# Patient Record
Sex: Male | Born: 1990 | Race: Black or African American | Hispanic: No | Marital: Single | Smoking: Former smoker
Health system: Southern US, Community
[De-identification: ages and names within clinical notes are randomized; demographics above are authoritative.]

---

## 2014-06-25 ENCOUNTER — Emergency Department (HOSPITAL_COMMUNITY)
Admission: EM | Admit: 2014-06-25 | Discharge: 2014-06-25 | Disposition: A | Discharge status: Home / Self Care | Payer: Managed Care, Other (non HMO) | Attending: Emergency Medicine | Admitting: Emergency Medicine

## 2014-06-25 ENCOUNTER — Emergency Department (HOSPITAL_COMMUNITY): Payer: Managed Care, Other (non HMO)

## 2014-06-25 ENCOUNTER — Emergency Department (INDEPENDENT_AMBULATORY_CARE_PROVIDER_SITE_OTHER)
Admission: EM | Admit: 2014-06-25 | Discharge: 2014-06-25 | Disposition: A | Discharge status: Nursing Home (Includes ICF) | Payer: Managed Care, Other (non HMO) | Source: Home / Self Care

## 2014-06-25 ENCOUNTER — Encounter (HOSPITAL_COMMUNITY): Payer: Self-pay | Admitting: Emergency Medicine

## 2014-06-25 DIAGNOSIS — Z7982 Long term (current) use of aspirin: Secondary | ICD-10-CM | POA: Diagnosis not present

## 2014-06-25 DIAGNOSIS — Z79899 Other long term (current) drug therapy: Secondary | ICD-10-CM | POA: Insufficient documentation

## 2014-06-25 DIAGNOSIS — Z87891 Personal history of nicotine dependence: Secondary | ICD-10-CM | POA: Diagnosis not present

## 2014-06-25 DIAGNOSIS — R079 Chest pain, unspecified: Secondary | ICD-10-CM | POA: Diagnosis present

## 2014-06-25 DIAGNOSIS — I209 Angina pectoris, unspecified: Secondary | ICD-10-CM

## 2014-06-25 LAB — CBC WITH DIFFERENTIAL/PLATELET
Basophils Absolute: 0 10*3/uL (ref 0.0–0.1)
Basophils Relative: 0 % (ref 0–1)
EOS ABS: 0.3 10*3/uL (ref 0.0–0.7)
EOS PCT: 3 % (ref 0–5)
HEMATOCRIT: 43.5 % (ref 39.0–52.0)
HEMOGLOBIN: 15.2 g/dL (ref 13.0–17.0)
LYMPHS ABS: 2.9 10*3/uL (ref 0.7–4.0)
Lymphocytes Relative: 31 % (ref 12–46)
MCH: 30.1 pg (ref 26.0–34.0)
MCHC: 34.9 g/dL (ref 30.0–36.0)
MCV: 86.1 fL (ref 78.0–100.0)
MONOS PCT: 9 % (ref 3–12)
Monocytes Absolute: 0.8 10*3/uL (ref 0.1–1.0)
Neutro Abs: 5.3 10*3/uL (ref 1.7–7.7)
Neutrophils Relative %: 57 % (ref 43–77)
Platelets: 215 10*3/uL (ref 150–400)
RBC: 5.05 MIL/uL (ref 4.22–5.81)
RDW: 13.3 % (ref 11.5–15.5)
WBC: 9.3 10*3/uL (ref 4.0–10.5)

## 2014-06-25 LAB — BASIC METABOLIC PANEL
Anion gap: 13 (ref 5–15)
BUN: 13 mg/dL (ref 6–23)
CALCIUM: 9.4 mg/dL (ref 8.4–10.5)
CO2: 26 meq/L (ref 19–32)
Chloride: 102 mEq/L (ref 96–112)
Creatinine, Ser: 1.09 mg/dL (ref 0.50–1.35)
GFR calc Af Amer: >90 mL/min (ref >90)
GFR calc non Af Amer: >90 mL/min (ref >90)
GLUCOSE: 87 mg/dL (ref 70–99)
Potassium: 3.9 mEq/L (ref 3.7–5.3)
SODIUM: 141 meq/L (ref 137–147)

## 2014-06-25 LAB — TROPONIN I: Troponin I: <0.3 ng/mL (ref <0.30)

## 2014-06-25 MED ORDER — NITROGLYCERIN 0.4 MG SL SUBL
SUBLINGUAL_TABLET | SUBLINGUAL | Status: AC
  Filled 2014-06-25: qty 1

## 2014-06-25 MED ORDER — SODIUM CHLORIDE 0.9 % IV SOLN
INTRAVENOUS | Status: DC
  Administered 2014-06-25: 19:00:00 via INTRAVENOUS

## 2014-06-25 MED ORDER — IBUPROFEN 800 MG PO TABS: 800.0000 mg | ORAL_TABLET | Freq: Three times a day (TID) | ORAL | Status: AC

## 2014-06-25 MED ORDER — KETOROLAC TROMETHAMINE 30 MG/ML IJ SOLN
30.0000 mg | Freq: Once | INTRAMUSCULAR | Status: AC
Start: 2014-06-25 — End: 2014-06-25
  Administered 2014-06-25: 30 mg via INTRAVENOUS
  Filled 2014-06-25: qty 1

## 2014-06-25 MED ORDER — ASPIRIN 81 MG PO CHEW
324.0000 mg | CHEWABLE_TABLET | Freq: Once | ORAL | Status: AC
  Administered 2014-06-25: 324 mg via ORAL

## 2014-06-25 MED ORDER — NITROGLYCERIN 0.4 MG SL SUBL
0.4000 mg | SUBLINGUAL_TABLET | SUBLINGUAL | Status: AC | PRN
  Administered 2014-06-25: 0.4 mg via SUBLINGUAL

## 2014-06-25 MED ORDER — ASPIRIN 81 MG PO CHEW
CHEWABLE_TABLET | ORAL | Status: AC
  Filled 2014-06-25: qty 4

## 2014-06-25 NOTE — ED Provider Notes (Signed)
Chief Complaint   Chest Pain   History of Present Illness    Ivan Crawford is a 23 year old male who presents with left pectoral chest pain radiating through to the left upper back since 5 PM today. The patient states he was walking fast at the time, but his parents state he lifted a heavy rug at that time. The pain feels like a stabbing and is rated 6/10 in intensity. It's been associated with some shortness of breath and rapid heart rate. He denies any associated nausea, diaphoresis, or dizziness. He's had no fever, chills, coughing, or wheezing. He denies any dizziness or presyncope. He's had no abdominal pain, nausea, or vomiting. He's been having similar pains for the past 9 months. These have been occurring about once a month and they are exertional, lasting for minutes at a time. He's had 4 none of similar episodes in the past week. None of these have lasted as long as this episode. He is a former smoker and his father has had multiple procedures for ischemic heart disease since age 40. He has no history of high blood pressure although his blood pressure is elevated today. No history of diabetes or hypercholesterolemia. He denies any leg pain or or swelling.  Review of Systems    Other than noted above, the patient denies any of the following symptoms. Systemic:  No fever or chills. Pulmonary:  No cough, wheezing, shortness of breath, sputum production, hemoptysis. Cardiac:  No palpitations, rapid heartbeat, dizziness, presyncope or syncope. GI:  No abdominal pain, heartburn, nausea, or vomiting. Ext:  No leg pain or swelling.  PMFSH    Past medical history, family history, social history, meds, and allergies were reviewed.   Physical Exam     Vital signs:  BP 163/75  Pulse 53  Temp(Src) 98.6 F (37 C) (Oral)  Resp 16  SpO2 100% Gen:  Alert, oriented, in no distress, skin warm and dry. ENT:  Mucous membranes moist, pharynx clear. Neck:  Supple, no adenopathy or tenderness.   No JVD. Lungs:  Clear to auscultation, no wheezes, rales or rhonchi.  No respiratory distress. Heart:  Regular rhythm.  No gallops, murmers, clicks or rubs. Chest:  There is tenderness to palpation in the left pectoral area. Abdomen:  Soft, nontender, no organomegaly or mass.  Bowel sounds normal.  No pulsatile abdominal mass or bruit. Ext:  No edema.  No calf tenderness and Homann's sign negative.  Pulses full and equal. Skin:  Warm and dry.  No rash.  EKG Results:  Date: 06/25/2014  Rate: 55  Rhythm: sinus bradycardia and sinus arrhythmia  QRS Axis: normal--58  Intervals: normal--QTc interval 371 ms  ST/T Wave abnormalities: normal  Conduction Disutrbances:none  Narrative Interpretation: Sinus bradycardia with sinus arrhythmia, otherwise normal EKG, no ischemic changes.  Old EKG Reviewed: none available    Course in Urgent Care Center   The following medications were given:  Medications  0.9 %  sodium chloride infusion   aspirin chewable tablet 324 mg   nitroGLYCERIN (NITROSTAT) SL tablet 0.4 mg  Assessment     The encounter diagnosis was Angina pectoris.  Differential diagnosis is acute coronary syndrome, pulmonary embolism, ruptured aneurysm, pneumothorax, Boerhaave syndrome, pericarditis, musculoskeletal pain, or reflux esophagitis.   Plan     The patient was transferred to the ED via the [facility] in stable condition.  Medical Decision Making:  23 year old male with risk factors of positive family history and ex-cigarette smoker presents tonight with history of left pectoral chest pain with radiation to back since 5 p.m. Today.  No nausea or diaphoresis, but does have shortness of breath and palpitations.  He's had similar pains off and on for the past 9 months, and these seem to be getting more frequent.  Pains are exertional and  last for minutes.  His EKG is normal, but story is concerning for crescendo angina. Will give TNG and ASA and send via [place] [facility].       Reuben Likes, MD 06/25/14 [phone]

## 2014-06-25 NOTE — ED Provider Notes (Signed)
CSN: [id number]     Arrival date & time 06/25/14  1920 History   First MD Initiated Contact with Patient 06/25/14 1920     Chief Complaint  Patient presents with  . Chest Pain     (Consider location/radiation/quality/duration/timing/severity/associated sxs/prior Treatment) HPI  Patient presents with concern of ongoing chest pain.  Pain is present for several months, beginning without clear precipitant.  Since onset has been episodic, seemingly occurring with lifting, exertion, but not with prolonged exertion, or with deep inspiration. Pain is left upper chest, nonradiating, sore, sharp, improved with rest No concurrent syncope, near syncope, post exercise lightheadedness or syncope. Patient used to smoke, drink, use marijuana. Patient endorses family history heart disease, though no early cardiac death.   History reviewed. No pertinent past medical history. History reviewed. No pertinent past surgical history. History reviewed. No pertinent family history. History  Substance Use Topics  . Smoking status: Former Games developer  . Smokeless tobacco: Not on file  . Alcohol Use: Not on file    Review of Systems  Constitutional:       Per HPI, otherwise negative  HENT:       Per HPI, otherwise negative  Respiratory:       Per HPI, otherwise negative  Cardiovascular:       Per HPI, otherwise negative  Gastrointestinal: Negative for vomiting.  Endocrine:       Negative aside from HPI  Genitourinary:       Neg aside from HPI   Musculoskeletal:       Per HPI, otherwise negative  Skin: Negative.   Neurological: Negative for syncope.      Allergies  Review of patient's allergies indicates no known allergies.  Home Medications   Prior to Admission medications   Medication Sig Start Date End Date Taking? Authorizing Provider  aspirin 81 MG chewable tablet Chew 324 mg by mouth once.    Historical Provider, MD  nitroGLYCERIN (NITROSTAT) 0.4 MG SL tablet Place 0.4 mg under the  tongue once.    Historical Provider, MD   BP 135/65  Pulse 47  Temp(Src) 98.3 F (36.8 C) (Oral)  Resp 12  Ht 5' 10.5" (1.791 m)  Wt 154 lb (69.854 kg)  BMI 21.78 kg/m2  SpO2 99% Physical Exam  Nursing note and vitals reviewed. Constitutional: He is oriented to person, place, and time. He appears well-developed. No distress.  HENT:  Head: Normocephalic and atraumatic.  Eyes: Conjunctivae and EOM are normal.  Cardiovascular: Normal rate and regular rhythm.   Pulmonary/Chest: Effort normal. No stridor. No respiratory distress.  Abdominal: He exhibits no distension.  Musculoskeletal: He exhibits no edema.  Neurological: He is alert and oriented to person, place, and time.  Skin: Skin is warm and dry.  Psychiatric: He has a normal mood and affect.    ED Course  Procedures (including critical care time)  Imaging labs Review I reviewed the imaging studies, all labs  EKG Interpretation   Date/Time:  Saturday June 25 2014 19:34:33 EDT Ventricular Rate:  50 PR Interval:  136 QRS Duration: 87 QT Interval:  427 QTC Calculation: 389 R Axis:   79 Text Interpretation:  Sinus rhythm ST elev, probable normal early repol  pattern Sinus rhythm Artifact Early repolarization pattern Borderline ECG  Confirmed by Gerhard Munch  MD [phone]) on 06/25/2014 7:58:27 PM     I reviewed the patient's EKG from urgent care as well as hours, no substantial changes.  On repeat exam the patient is in no  distress with no pain  MDM   Generally well-appearing young male presents with ongoing episodic left-sided chest pain.  Patient is awake, alert, in no distress.  Given the chronicity pain, the absence of substantial risk factors, his unremarkable VS, there is low suspicion for ongoing ischemia.  Patient's pain is likely musculoskeletal. Patient was started on a course of anti-inflammatory, discharged to follow up with primary care.  As he is non-hypoxic, with no dyspnea or increased respiratory  effort, thromboembolic processes are unlikely. Absent lightheadedness or post exertional syncope, occult cardiomyopathy also is unlikely.      Gerhard Munch, MD 06/25/14 2159

## 2014-06-25 NOTE — ED Notes (Signed)
Patient transported to X-ray 

## 2014-06-25 NOTE — ED Notes (Signed)
Patient states was at work today and started to experience chest pain Patient states does have a history of heart disease in his family

## 2014-06-25 NOTE — Discharge Instructions (Signed)
As discussed, your evaluation today has been largely reassuring.  But, it is important that you monitor your condition carefully, and do not hesitate to return to the ED if you develop new, or concerning changes in your condition. ° °Otherwise, please follow-up with your physician for appropriate ongoing care. ° °Chest Pain (Nonspecific) °It is often hard to give a specific diagnosis for the cause of chest pain. There is always a chance that your pain could be related to something serious, such as a heart attack or a blood clot in the lungs. You need to follow up with your health care provider for further evaluation. °CAUSES  °· Heartburn. °· Pneumonia or bronchitis. °· Anxiety or stress. °· Inflammation around your heart (pericarditis) or lung (pleuritis or pleurisy). °· A blood clot in the lung. °· A collapsed lung (pneumothorax). It can develop suddenly on its own (spontaneous pneumothorax) or from trauma to the chest. °· Shingles infection (herpes zoster virus). °The chest wall is composed of bones, muscles, and cartilage. Any of these can be the source of the pain. °· The bones can be bruised by injury. °· The muscles or cartilage can be strained by coughing or overwork. °· The cartilage can be affected by inflammation and become sore (costochondritis). °DIAGNOSIS  °Lab tests or other studies may be needed to find the cause of your pain. Your health care provider may have you take a test called an ambulatory electrocardiogram (ECG). An ECG records your heartbeat patterns over a 24-hour period. You may also have other tests, such as: °· Transthoracic echocardiogram (TTE). During echocardiography, sound waves are used to evaluate how blood flows through your heart. °· Transesophageal echocardiogram (TEE). °· Cardiac monitoring. This allows your health care provider to monitor your heart rate and rhythm in real time. °· Holter monitor. This is a portable device that records your heartbeat and can help diagnose  heart arrhythmias. It allows your health care provider to track your heart activity for several days, if needed. °· Stress tests by exercise or by giving medicine that makes the heart beat faster. °TREATMENT  °· Treatment depends on what may be causing your chest pain. Treatment may include: °¨ Acid blockers for heartburn. °¨ Anti-inflammatory medicine. °¨ Pain medicine for inflammatory conditions. °¨ Antibiotics if an infection is present. °· You may be advised to change lifestyle habits. This includes stopping smoking and avoiding alcohol, caffeine, and chocolate. °· You may be advised to keep your head raised (elevated) when sleeping. This reduces the chance of acid going backward from your stomach into your esophagus. °Most of the time, nonspecific chest pain will improve within 2-3 days with rest and mild pain medicine.  °HOME CARE INSTRUCTIONS  °· If antibiotics were prescribed, take them as directed. Finish them even if you start to feel better. °· For the next few days, avoid physical activities that bring on chest pain. Continue physical activities as directed. °· Do not use any tobacco products, including cigarettes, chewing tobacco, or electronic cigarettes. °· Avoid drinking alcohol. °· Only take medicine as directed by your health care provider. °· Follow your health care provider's suggestions for further testing if your chest pain does not go away. °· Keep any follow-up appointments you made. If you do not go to an appointment, you could develop lasting (chronic) problems with pain. If there is any problem keeping an appointment, call to reschedule. °SEEK MEDICAL CARE IF:  °· Your chest pain does not go away, even after treatment. °· You have   a rash with blisters on your chest. °· You have a fever. °SEEK IMMEDIATE MEDICAL CARE IF:  °· You have increased chest pain or pain that spreads to your arm, neck, jaw, back, or abdomen. °· You have shortness of breath. °· You have an increasing cough, or you  cough up blood. °· You have severe back or abdominal pain. °· You feel nauseous or vomit. °· You have severe weakness. °· You faint. °· You have chills. °This is an emergency. Do not wait to see if the pain will go away. Get medical help at once. Call your local emergency services (911 in [place] Do not drive yourself to the hospital. °MAKE SURE YOU:  °· Understand these instructions. °· Will watch your condition. °· Will get help right away if you are not doing well or get worse. °Document Released: 06/26/2005 Document Revised: 09/21/2013 Document Reviewed: 04/21/2008 °ExitCare® Patient Information ©2015 [employer]. This information is not intended to replace advice given to you by your health care provider. Make sure you discuss any questions you have with your health care provider. ° °

## 2014-06-25 NOTE — Discharge Instructions (Signed)
We have determined that your problem requires further evaluation in the emergency department.  We will take care of your transport there.  Once at the emergency department, you will be evaluated by a provider and they will order whatever treatment or tests they deem necessary.  We cannot guarantee that they will do any specific test or do any specific treatment.  ° °

## 2014-06-25 NOTE — ED Notes (Signed)
To room via EMS.  Pt transported from urgent care.  Pt reports onset 5pm left sided chest pain.  Constant, stabbing.  No c/o pain at this time.  No other s/s noted.  Pt at work at onset.  Pt reports same pain intermittantly x 9 months.

## 2014-08-07 ENCOUNTER — Encounter (HOSPITAL_COMMUNITY): Payer: Self-pay | Admitting: *Deleted

## 2014-08-07 ENCOUNTER — Emergency Department (INDEPENDENT_AMBULATORY_CARE_PROVIDER_SITE_OTHER)
Admission: EM | Admit: 2014-08-07 | Discharge: 2014-08-07 | Disposition: A | Discharge status: Home / Self Care | Payer: Managed Care, Other (non HMO) | Source: Home / Self Care | Attending: Family Medicine | Admitting: Family Medicine

## 2014-08-07 DIAGNOSIS — L508 Other urticaria: Secondary | ICD-10-CM

## 2014-08-07 MED ORDER — TRIAMCINOLONE ACETONIDE 40 MG/ML IJ SUSP
INTRAMUSCULAR | Status: AC
  Filled 2014-08-07: qty 1

## 2014-08-07 MED ORDER — DIPHENHYDRAMINE HCL 25 MG PO CAPS
50.0000 mg | ORAL_CAPSULE | Freq: Once | ORAL | Status: AC
  Administered 2014-08-07: 50 mg via ORAL

## 2014-08-07 MED ORDER — TRIAMCINOLONE ACETONIDE 40 MG/ML IJ SUSP
40.0000 mg | Freq: Once | INTRAMUSCULAR | Status: AC
  Administered 2014-08-07: 40 mg via INTRAMUSCULAR

## 2014-08-07 MED ORDER — METHYLPREDNISOLONE ACETATE 80 MG/ML IJ SUSP
INTRAMUSCULAR | Status: AC
  Filled 2014-08-07: qty 1

## 2014-08-07 MED ORDER — METHYLPREDNISOLONE ACETATE PF 80 MG/ML IJ SUSP
80.0000 mg | Freq: Once | INTRAMUSCULAR | Status: AC
  Administered 2014-08-07: 80 mg via INTRAMUSCULAR

## 2014-08-07 MED ORDER — DIPHENHYDRAMINE HCL 25 MG PO CAPS
ORAL_CAPSULE | ORAL | Status: AC
  Filled 2014-08-07: qty 2

## 2014-08-07 NOTE — ED Notes (Addendum)
Pt  Reports  He  Developed      A  Rash  Today   With  Hives        And   Some  Shortness     Of           Breath  Earlier            At  This  Time   He       Is  Sitting upright on     Exam  Table         Alert  And  Oriented   Used  New  Detergent today

## 2014-08-07 NOTE — ED Provider Notes (Addendum)
CSN: [id number]     Arrival date & time 08/07/14  1600 History   First MD Initiated Contact with Patient 08/07/14 1613     Chief Complaint  Patient presents with  . Rash   (Consider location/radiation/quality/duration/timing/severity/associated sxs/prior Treatment) Patient is a 23 y.o. male presenting with rash. The history is provided by the patient.  Rash Location:  Torso Torso rash location:  R chest, L chest, abd RUQ and abd RLQ Quality: itchiness   Severity:  Mild Progression:  Unchanged Chronicity:  New Context: new detergent/soap   Context comment:  Onset after putting on new shirt. no resp problems. Associated symptoms: no fever, no shortness of breath, no sore throat, no throat swelling, no tongue swelling and not wheezing     History reviewed. No pertinent past medical history. History reviewed. No pertinent past surgical history. History reviewed. No pertinent family history. History  Substance Use Topics  . Smoking status: Former Games developermoker  . Smokeless tobacco: Not on file  . Alcohol Use: No    Review of Systems  Constitutional: Negative for fever.  HENT: Negative for sore throat.   Respiratory: Negative for shortness of breath and wheezing.   Cardiovascular: Negative.   Gastrointestinal: Negative.   Musculoskeletal: Negative.   Skin: Positive for rash.    Allergies  Review of patient's allergies indicates no known allergies.  Home Medications   Prior to Admission medications   Medication Sig Start Date End Date Taking? Authorizing Provider  aspirin 81 MG chewable tablet Chew 324 mg by mouth once.    Historical Provider, MD  ibuprofen (ADVIL,MOTRIN) 800 MG tablet Take 1 tablet (800 mg total) by mouth 3 (three) times daily. 06/25/14   Gerhard Munchobert Lockwood, MD  nitroGLYCERIN (NITROSTAT) 0.4 MG SL tablet Place 0.4 mg under the tongue once.    Historical Provider, MD   BP 143/93 mmHg  Pulse 76  Temp(Src) 97.7 F (36.5 C) (Oral)  Resp 18  SpO2 95% Physical Exam   Constitutional: He is oriented to person, place, and time. He appears well-developed and well-nourished. No distress.  HENT:  Mouth/Throat: Oropharynx is clear and moist.  Neck: Normal range of motion. Neck supple.  Cardiovascular: Regular rhythm and normal heart sounds.   Pulmonary/Chest: Effort normal and breath sounds normal.  Neurological: He is alert and oriented to person, place, and time.  Skin: Skin is warm and dry. Rash noted.  Urticarial Edmonia Caprio/hives diffuse rash  Nursing note and vitals reviewed.   ED Course  Procedures (including critical care time) Labs Review Labs Reviewed - No data to display  Imaging Review No results found.   MDM   1. Urticaria, acute        Linna HoffJames D Kindl, MD 08/07/14 1627  Linna HoffJames D Kindl, MD 08/07/14 1725

## 2014-08-07 NOTE — Discharge Instructions (Signed)
Use benadryl as needed, see your doctor or return as needed.

## 2015-01-27 ENCOUNTER — Emergency Department (INDEPENDENT_AMBULATORY_CARE_PROVIDER_SITE_OTHER)
Admission: EM | Admit: 2015-01-27 | Discharge: 2015-01-27 | Disposition: A | Discharge status: Home / Self Care | Payer: Managed Care, Other (non HMO) | Source: Home / Self Care | Attending: Family Medicine | Admitting: Family Medicine

## 2015-01-27 ENCOUNTER — Encounter (HOSPITAL_COMMUNITY): Payer: Self-pay | Admitting: *Deleted

## 2015-01-27 DIAGNOSIS — R059 Cough, unspecified: Secondary | ICD-10-CM

## 2015-01-27 DIAGNOSIS — R05 Cough: Secondary | ICD-10-CM

## 2015-01-27 MED ORDER — FEXOFENADINE HCL 180 MG PO TABS: 180.0000 mg | ORAL_TABLET | Freq: Every day | ORAL | Status: AC

## 2015-01-27 MED ORDER — PSEUDOEPH-BROMPHEN-DM 30-2-10 MG/5ML PO SYRP: 10.0000 mL | ORAL_SOLUTION | Freq: Four times a day (QID) | ORAL | Status: AC | PRN

## 2015-01-27 MED ORDER — FLUTICASONE PROPIONATE 50 MCG/ACT NA SUSP: 2.0000 | Freq: Two times a day (BID) | NASAL | Status: AC

## 2015-01-27 MED ORDER — OMEPRAZOLE 20 MG PO CPDR: 20.0000 mg | DELAYED_RELEASE_CAPSULE | Freq: Every day | ORAL | Status: AC

## 2015-01-27 NOTE — ED Provider Notes (Signed)
CSN: [id number]     Arrival date & time 01/27/15  [id number] History   First MD Initiated Contact with Patient 01/27/15 1114     Chief Complaint  Patient presents with  . URI   (Consider location/radiation/quality/duration/timing/severity/associated sxs/prior Treatment) HPI      24 year old male presents for evaluation of a cough. This is been present for approximately 2 weeks. He describes it as a very hard, forceful cough. It is occasionally productive and dry at other times. He denies any other associated symptoms including no fever, chest pain, shortness of breath, rhinorrhea, postnasal drip. He denies any illness preceding this. He admits to a history of occasional heartburn and reflux. He also has seasonal allergies but they have not been bothering him much now.   History reviewed. No pertinent past medical history. History reviewed. No pertinent past surgical history. History reviewed. No pertinent family history. History  Substance Use Topics  . Smoking status: Former Games developermoker  . Smokeless tobacco: Not on file  . Alcohol Use: No    Review of Systems  Constitutional: Negative for fever and chills.  HENT: Negative for congestion and rhinorrhea.   Respiratory: Positive for cough. Negative for shortness of breath.   Cardiovascular: Negative for chest pain.  All other systems reviewed and are negative.   Allergies  Review of patient's allergies indicates no known allergies.  Home Medications   Prior to Admission medications   Medication Sig Start Date End Date Taking? Authorizing Provider  aspirin 81 MG chewable tablet Chew 324 mg by mouth once.    Historical Provider, MD  brompheniramine-pseudoephedrine-DM 30-2-10 MG/5ML syrup Take 10 mLs by mouth 4 (four) times daily as needed. 01/27/15   Graylon GoodZachary H Connelly [place], PA-C  fexofenadine (ALLEGRA ALLERGY) 180 MG tablet Take 1 tablet (180 mg total) by mouth daily. 01/27/15   Adrian BlackwaterZachary H Fiza Nation, PA-C  fluticasone (FLONASE) 50 MCG/ACT nasal spray Place  2 sprays into both nostrils 2 (two) times daily. Decrease to 2 sprays/nostril daily after 5 days 01/27/15   Graylon GoodZachary H Floreine Kingdon, PA-C  ibuprofen (ADVIL,MOTRIN) 800 MG tablet Take 1 tablet (800 mg total) by mouth 3 (three) times daily. 06/25/14   Gerhard Munchobert Lockwood, MD  nitroGLYCERIN (NITROSTAT) 0.4 MG SL tablet Place 0.4 mg under the tongue once.    Historical Provider, MD  omeprazole (PRILOSEC) 20 MG capsule Take 1 capsule (20 mg total) by mouth daily. 01/27/15   Adrian BlackwaterZachary H Hipolito Martinezlopez, PA-C   BP 144/71 mmHg  Pulse 64  Temp(Src) 98.6 F (37 C) (Oral)  Resp 14  SpO2 98% Physical Exam  Constitutional: He is oriented to person, place, and time. He appears well-developed and well-nourished. No distress.  HENT:  Head: Normocephalic and atraumatic.  Right Ear: External ear normal.  Left Ear: External ear normal.  Nose: Nose normal.  Mouth/Throat: Oropharynx is clear and moist. No oropharyngeal exudate.  Eyes: Conjunctivae are normal.  Neck: Normal range of motion. Neck supple.  Cardiovascular: Normal rate, regular rhythm and normal heart sounds.   Pulmonary/Chest: Effort normal and breath sounds normal. No respiratory distress.  Lymphadenopathy:    He has no cervical adenopathy.  Neurological: He is alert and oriented to person, place, and time. Coordination normal.  Skin: Skin is warm and dry. No rash noted. He is not diaphoretic.  Psychiatric: He has a normal mood and affect. Judgment normal.  Nursing note and vitals reviewed.   ED Course  Procedures (including critical care time) Labs Review Labs Reviewed - No data to display  Imaging  Review No results found.   MDM   1. Cough     vitals and physical exam are entirely normal. Treat for both allergies and for silent reflux. Also cough suppressant. Follow-up if no improvement in a couple weeks  Meds ordered this encounter  Medications  . fluticasone (FLONASE) 50 MCG/ACT nasal spray    Sig: Place 2 sprays into both nostrils 2 (two) times  daily. Decrease to 2 sprays/nostril daily after 5 days    Dispense:  16 g    Refill:  2  . fexofenadine (ALLEGRA ALLERGY) 180 MG tablet    Sig: Take 1 tablet (180 mg total) by mouth daily.    Dispense:  30 tablet    Refill:  2  . omeprazole (PRILOSEC) 20 MG capsule    Sig: Take 1 capsule (20 mg total) by mouth daily.    Dispense:  30 capsule    Refill:  2  . brompheniramine-pseudoephedrine-DM 30-2-10 MG/5ML syrup    Sig: Take 10 mLs by mouth 4 (four) times daily as needed.    Dispense:  120 mL    Refill:  2       Graylon Good, PA-C 01/27/15 1138

## 2015-01-27 NOTE — Discharge Instructions (Signed)
Cough, Adult  A cough is a reflex that helps clear your throat and airways. It can help heal the body or may be a reaction to an irritated airway. A cough may only last 2 or 3 weeks (acute) or may last more than 8 weeks (chronic).  CAUSES Acute cough:  Viral or bacterial infections. Chronic cough:  Infections.  Allergies.  Asthma.  Post-nasal drip.  Smoking.  Heartburn or acid reflux.  Some medicines.  Chronic lung problems (COPD).  Cancer. SYMPTOMS   Cough.  Fever.  Chest pain.  Increased breathing rate.  High-pitched whistling sound when breathing (wheezing).  Colored mucus that you cough up (sputum). TREATMENT   A bacterial cough may be treated with antibiotic medicine.  A viral cough must run its course and will not respond to antibiotics.  Your caregiver may recommend other treatments if you have a chronic cough. HOME CARE INSTRUCTIONS   Only take over-the-counter or prescription medicines for pain, discomfort, or fever as directed by your caregiver. Use cough suppressants only as directed by your caregiver.  Use a cold steam vaporizer or humidifier in your bedroom or home to help loosen secretions.  Sleep in a semi-upright position if your cough is worse at night.  Rest as needed.  Stop smoking if you smoke. SEEK IMMEDIATE MEDICAL CARE IF:   You have pus in your sputum.  Your cough starts to worsen.  You cannot control your cough with suppressants and are losing sleep.  You begin coughing up blood.  You have difficulty breathing.  You develop pain which is getting worse or is uncontrolled with medicine.  You have a fever. MAKE SURE YOU:   Understand these instructions.  Will watch your condition.  Will get help right away if you are not doing well or get worse. Document Released: 03/15/2011 Document Revised: 12/09/2011 Document Reviewed: 03/15/2011 ExitCare Patient Information 2015 [employer]. This information is not intended  to replace advice given to you by your health care provider. Make sure you discuss any questions you have with your health care provider.  

## 2015-01-27 NOTE — ED Notes (Signed)
Pt  Reports symptoms    Of  Cough  Congested    With  Onset  Of symptoms    For  sev   Weeks         Pt  Reports  Symptoms  Not releived  By  otc  meds     Pt  Sitting  Upright on  The  Exam table speaking in   Complete  sentances  And  Is  In no  Acute  Distress

## 2015-03-27 ENCOUNTER — Encounter (HOSPITAL_COMMUNITY): Payer: Self-pay | Admitting: Emergency Medicine

## 2015-03-27 ENCOUNTER — Emergency Department (INDEPENDENT_AMBULATORY_CARE_PROVIDER_SITE_OTHER)
Admission: EM | Admit: 2015-03-27 | Discharge: 2015-03-27 | Disposition: A | Discharge status: Home / Self Care | Payer: Managed Care, Other (non HMO) | Source: Home / Self Care | Attending: Family Medicine | Admitting: Family Medicine

## 2015-03-27 DIAGNOSIS — M7671 Peroneal tendinitis, right leg: Secondary | ICD-10-CM

## 2015-03-27 DIAGNOSIS — M7751 Other enthesopathy of right foot: Secondary | ICD-10-CM

## 2015-03-27 NOTE — Discharge Instructions (Signed)
Peroneal Tendinitis with Rehab Tendonitis is inflammation of a tendon. Inflammation of the tendons on the back of the outer ankle (peroneal tendons) is known as peroneal tendonitis. The peroneal tendons are responsible for connecting the muscles that allow you to stand on your tiptoes to the bones of the ankle. For this reason, peroneal tendonitis often causes pain when trying to complete such motions. Peroneal tendonitis often involves a tear (strain) of the peroneal tendons. Strains are classified into three categories. Grade 1 strains cause pain, but the tendon is not lengthened. Grade 2 strains include a lengthened ligament, due to the ligament being stretched or partially ruptured. With grade 2 strains there is still function, although function may be decreased. Grade 3 strains involve a complete tear of the tendon or muscle, and function is usually impaired. SYMPTOMS   Pain, tenderness, swelling, warmth, or redness over the back of the outer side of the ankle, the outer part of the mid-foot, or the bottom of the arch.  Pain that gets worse with ankle motion (especially when pushing off or pushing down with the front of the foot), or when standing on the ball of the foot or pushing the foot outward.  Crackling sound (crepitation) when the tendon is moved or touched. CAUSES  Peroneal tendinitis occurs when injury to the peroneal tendons causes the body to respond with inflammation. Common causes of injury include:  An overuse injury, in which the groove behind the outer ankle (where the tendon is located) causes wear on the tendon.  A sudden stress placed on the tendon, such as from an increase in the intensity, frequency, or duration of training.  Direct hit (trauma) to the tendon.  Return to activity too soon after a previous ankle injury. RISK INCREASES WITH:  Sports that require sudden, repetitive pushing off of the foot, such as jumping or quick starts.  Kicking and running sports,  especially running down hills or long distances.  Poor strength and flexibility.  Previous injury to the foot, ankle, or leg. PREVENTION  Warm up and stretch properly before activity.  Allow for adequate recovery between workouts.  Maintain physical fitness:  Strength, flexibility, and endurance.  Cardiovascular fitness.  Complete rehabilitation after previous injury. PROGNOSIS  If treated properly, peroneal tendonitis usually heals within 6 weeks.  RELATED COMPLICATIONS  Longer healing time, if not properly treated or if not given enough time to heal.  Recurring symptoms if activity is resumed too soon, with overuse, or when using poor technique.  If untreated, tendinitis may result in tendon rupture, requiring surgery. TREATMENT  Treatment first involves the use of ice and medicine to reduce pain and inflammation. The use of strengthening and stretching exercises may help reduce pain with activity. These exercises may be performed at home or with a therapist. Sometimes, the foot and ankle will be restrained for 10 to 14 days to promote healing. Your caregiver may advise that you place a heel lift in your shoes to reduce the stress placed on the tendon. If nonsurgical treatment is unsuccessful, surgery to remove the inflamed tendon lining (sheath) may be advised.  MEDICATION   If pain medicine is needed, nonsteroidal anti-inflammatory medicines (aspirin and ibuprofen), or other minor pain relievers (acetaminophen), are often advised.  Do not take pain medicine for 7 days before surgery.  Prescription pain relievers may be given, if your caregiver thinks they are needed. Use only as directed and only as much as you need. HEAT AND COLD  Cold treatment (icing) should   be applied for 10 to 15 minutes every 2 to 3 hours for inflammation and pain, and immediately after activity that aggravates your symptoms. Use ice packs or an ice massage.  Heat treatment may be used before  performing stretching and strengthening activities prescribed by your caregiver, physical therapist, or athletic trainer. Use a heat pack or a warm water soak. SEEK MEDICAL CARE IF:  Symptoms get worse or do not improve in 2 to 4 weeks, despite treatment.  New, unexplained symptoms develop. (Drugs used in treatment may produce side effects.) EXERCISES RANGE OF MOTION (ROM) AND STRETCHING EXERCISES - Peroneal Tendinitis These exercises may help you when beginning to rehabilitate your injury. Your symptoms may resolve with or without further involvement from your physician, physical therapist or athletic trainer. While completing these exercises, remember:   Restoring tissue flexibility helps normal motion to return to the joints. This allows healthier, less painful movement and activity.  An effective stretch should be held for at least 30 seconds.  A stretch should never be painful. You should only feel a gentle lengthening or release in the stretched tissue. RANGE OF MOTION - Ankle Eversion  Sit with your right / left ankle crossed over your opposite knee.  Grip your foot with your opposite hand, placing your thumb on the top of your foot and your fingers across the bottom of your foot.  Gently push your foot downward with a slight rotation, so your littlest toes rise slightly toward the ceiling.  You should feel a gentle stretch on the inside of your ankle. Hold the stretch for __________ seconds. Repeat __________ times. Complete this exercise __________ times per day.  RANGE OF MOTION - Ankle Inversion  Sit with your right / left ankle crossed over your opposite knee.  Grip your foot with your opposite hand, placing your thumb on the bottom of your foot and your fingers across the top of your foot.  Gently pull your foot so the smallest toe comes toward you and your thumb pushes the inside of the ball of your foot away from you.  You should feel a gentle stretch on the outside of  your ankle. Hold the stretch for __________ seconds. Repeat __________ times. Complete this exercise __________ times per day.  RANGE OF MOTION - Ankle Plantar Flexion  Sit with your right / left leg crossed over your opposite knee.  Use your opposite hand to pull the top of your foot and toes toward you.  You should feel a gentle stretch on the top of your foot and ankle. Hold this position for __________ seconds. Repeat __________ times. Complete __________ times per day.  STRETCH - Gastroc, Standing  Place your hands on a wall.  Extend your right / left leg behind you, keeping the front knee somewhat bent.  Slightly point your toes inward on your back foot.  Keeping your right / left heel on the floor and your knee straight, shift your weight toward the wall, not allowing your back to arch.  You should feel a gentle stretch in the calf. Hold this position for __________ seconds. Repeat __________ times. Complete this stretch __________ times per day. STRETCH - Soleus, Standing  Place your hands on a wall.  Extend your right / left leg behind you, keeping the other knee somewhat bent.  Slightly point your toes inward on your back foot.  Keep your heel on the floor, bend your back knee, and slightly shift your weight over the back leg so that   you feel a gentle stretch deep in your back calf.  Hold this position for __________ seconds. Repeat __________ times. Complete this stretch __________ times per day. STRETCH - Gastrocsoleus, Standing Note: This exercise can place a lot of stress on your foot and ankle. Please complete this exercise only if specifically instructed by your caregiver.   Place the ball of your right / left foot on a step, keeping your other foot firmly on the same step.  Hold on to the wall or a rail for balance.  Slowly lift your other foot, allowing your body weight to press your heel down over the edge of the step.  You should feel a stretch in your  right / left calf.  Hold this position for __________ seconds.  Repeat this exercise with a slight bend in your knee. Repeat __________ times. Complete this stretch __________ times per day.  STRENGTHENING EXERCISES - Peroneal Tendinitis  These exercises may help you when beginning to rehabilitate your injury. They may resolve your symptoms with or without further involvement from your physician, physical therapist or athletic trainer. While completing these exercises, remember:   Muscles can gain both the endurance and the strength needed for everyday activities through controlled exercises.  Complete these exercises as instructed by your physician, physical therapist or athletic trainer. Increase the resistance and repetitions only as guided by your caregiver. STRENGTH - Dorsiflexors  Secure a rubber exercise band or tubing to a fixed object (table, pole) and loop the other end around your right / left foot.  Sit on the floor facing the fixed object. The band should be slightly tense when your foot is relaxed.  Slowly draw your foot back toward you, using your ankle and toes.  Hold this position for __________ seconds. Slowly release the tension in the band and return your foot to the starting position. Repeat __________ times. Complete this exercise __________ times per day.  STRENGTH - Towel Curls  Sit in a chair, on a non-carpeted surface.  Place your foot on a towel, keeping your heel on the floor.  Pull the towel toward your heel only by curling your toes. Keep your heel on the floor.  If instructed by your physician, physical therapist or athletic trainer, add weight to the end of the towel. Repeat __________ times. Complete this exercise __________ times per day. STRENGTH - Ankle Eversion   Secure one end of a rubber exercise band or tubing to a fixed object (table, pole). Loop the other end around your foot, just before your toes.  Place your fists between your knees.  This will focus your strengthening at your ankle.  Drawing the band across your opposite foot, away from the pole, slowly, pull your little toe out and up. Make sure the band is positioned to resist the entire motion.  Hold this position for __________ seconds.  Have your muscles resist the band, as it slowly pulls your foot back to the starting position. Repeat __________ times. Complete this exercise __________ times per day.  Document Released: 09/16/2005 Document Revised: 01/31/2014 Document Reviewed: 12/29/2008 [employer] Patient Information 2015 [employer]. This information is not intended to replace advice given to you by your health care provider. Make sure you discuss any questions you have with your health care provider.  

## 2015-03-27 NOTE — ED Provider Notes (Signed)
CSN: [id number]     Arrival date & time 03/27/15  1648 History   First MD Initiated Contact with Patient 03/27/15 1747     Chief Complaint  Patient presents with  . Foot Injury   (Consider location/radiation/quality/duration/timing/severity/associated sxs/prior Treatment) HPI Comments: 24 year old male was point basketball 2 days ago and he thought that he may have injured his right ankle and they did have mild pain there. He continued to play ball and even run the following day. While running he experienced a gradual onset of pain to the right posterior lateral ankle that tracks superiorly along the lateral lower leg. It is worse with ambulation. He is able to dorsiflex and plantarflex without pain. He is able to stand in the sand on his toes without pain.   History reviewed. No pertinent past medical history. History reviewed. No pertinent past surgical history. No family history on file. History  Substance Use Topics  . Smoking status: Former Games developer  . Smokeless tobacco: Not on file  . Alcohol Use: No    Review of Systems  Constitutional: Positive for activity change. Negative for fever and fatigue.  HENT: Negative.   Respiratory: Negative.   Gastrointestinal: Negative.   Musculoskeletal: Negative for myalgias, back pain, joint swelling, gait problem, neck pain and neck stiffness.  Skin: Negative.   Neurological: Negative.     Allergies  Review of patient's allergies indicates no known allergies.  Home Medications   Prior to Admission medications   Medication Sig Start Date End Date Taking? Authorizing Provider  aspirin 81 MG chewable tablet Chew 324 mg by mouth once.    Historical Provider, MD  brompheniramine-pseudoephedrine-DM 30-2-10 MG/5ML syrup Take 10 mLs by mouth 4 (four) times daily as needed. 01/27/15   Graylon Good, PA-C  fexofenadine (ALLEGRA ALLERGY) 180 MG tablet Take 1 tablet (180 mg total) by mouth daily. 01/27/15   Adrian Blackwater Baker, PA-C  fluticasone  (FLONASE) 50 MCG/ACT nasal spray Place 2 sprays into both nostrils 2 (two) times daily. Decrease to 2 sprays/nostril daily after 5 days 01/27/15   Graylon Good, PA-C  ibuprofen (ADVIL,MOTRIN) 800 MG tablet Take 1 tablet (800 mg total) by mouth 3 (three) times daily. 06/25/14   Gerhard Munch, MD  nitroGLYCERIN (NITROSTAT) 0.4 MG SL tablet Place 0.4 mg under the tongue once.    Historical Provider, MD  omeprazole (PRILOSEC) 20 MG capsule Take 1 capsule (20 mg total) by mouth daily. 01/27/15   Adrian Blackwater Baker, PA-C   BP 146/78 mmHg  Pulse 62  Temp(Src) 98.5 F (36.9 C) (Oral)  Resp 12  SpO2 100% Physical Exam  Constitutional: He is oriented to person, place, and time. He appears well-developed and well-nourished. No distress.  Neck: Normal range of motion. Neck supple.  Pulmonary/Chest: Effort normal. No respiratory distress.  Musculoskeletal: He exhibits tenderness. He exhibits no edema.  Palpation of the tendon reveals no tenderness. No tenderness to the posterior calf. There is tenderness immediately lateral to the Achilles tendon over the peroneus  longus. This tenderness tracks superiorly to the lateral lower extremity. Demonstrates dorsiflexion and plantarflexion without limitations. There is no bony tenderness to the ankle or foot. No swelling. No deformity. No calf swelling or right lower extremity edema. Normal color and warmth. Distal neurovascular motor sensory is intact.  Neurological: He is alert and oriented to person, place, and time. He exhibits normal muscle tone.  Skin: Skin is warm and dry.  Psychiatric: He has a normal mood and affect.  Nursing  note and vitals reviewed.   ED Course  Procedures (including critical care time) Labs Review Labs Reviewed - No data to display  Imaging Review No results found.   MDM   1. Peroneus longus tendonitis, right    ASO ICE Limit activity running jumping for 10-14 days.     Hayden Rasmussenavid Dailyn Kempner, NP 03/27/15 1806

## 2015-03-27 NOTE — ED Notes (Signed)
Pt comes in with right ankle pain radiating up lateral ankle. States he can feel tearing moving up leg. States he twisted ankle Sunday while playing basketball No home treatment  No swelling noted

## 2015-03-27 NOTE — ED Notes (Signed)
Pt left without ASO, will call to return

## 2016-06-25 IMAGING — CR DG CHEST 2V
2 series · 2 of 2 positions shown · non-contrast
Comparison: None.

CLINICAL DATA: Sharp left-sided chest pain

EXAM:
CHEST  2 VIEW

[w chest pa]
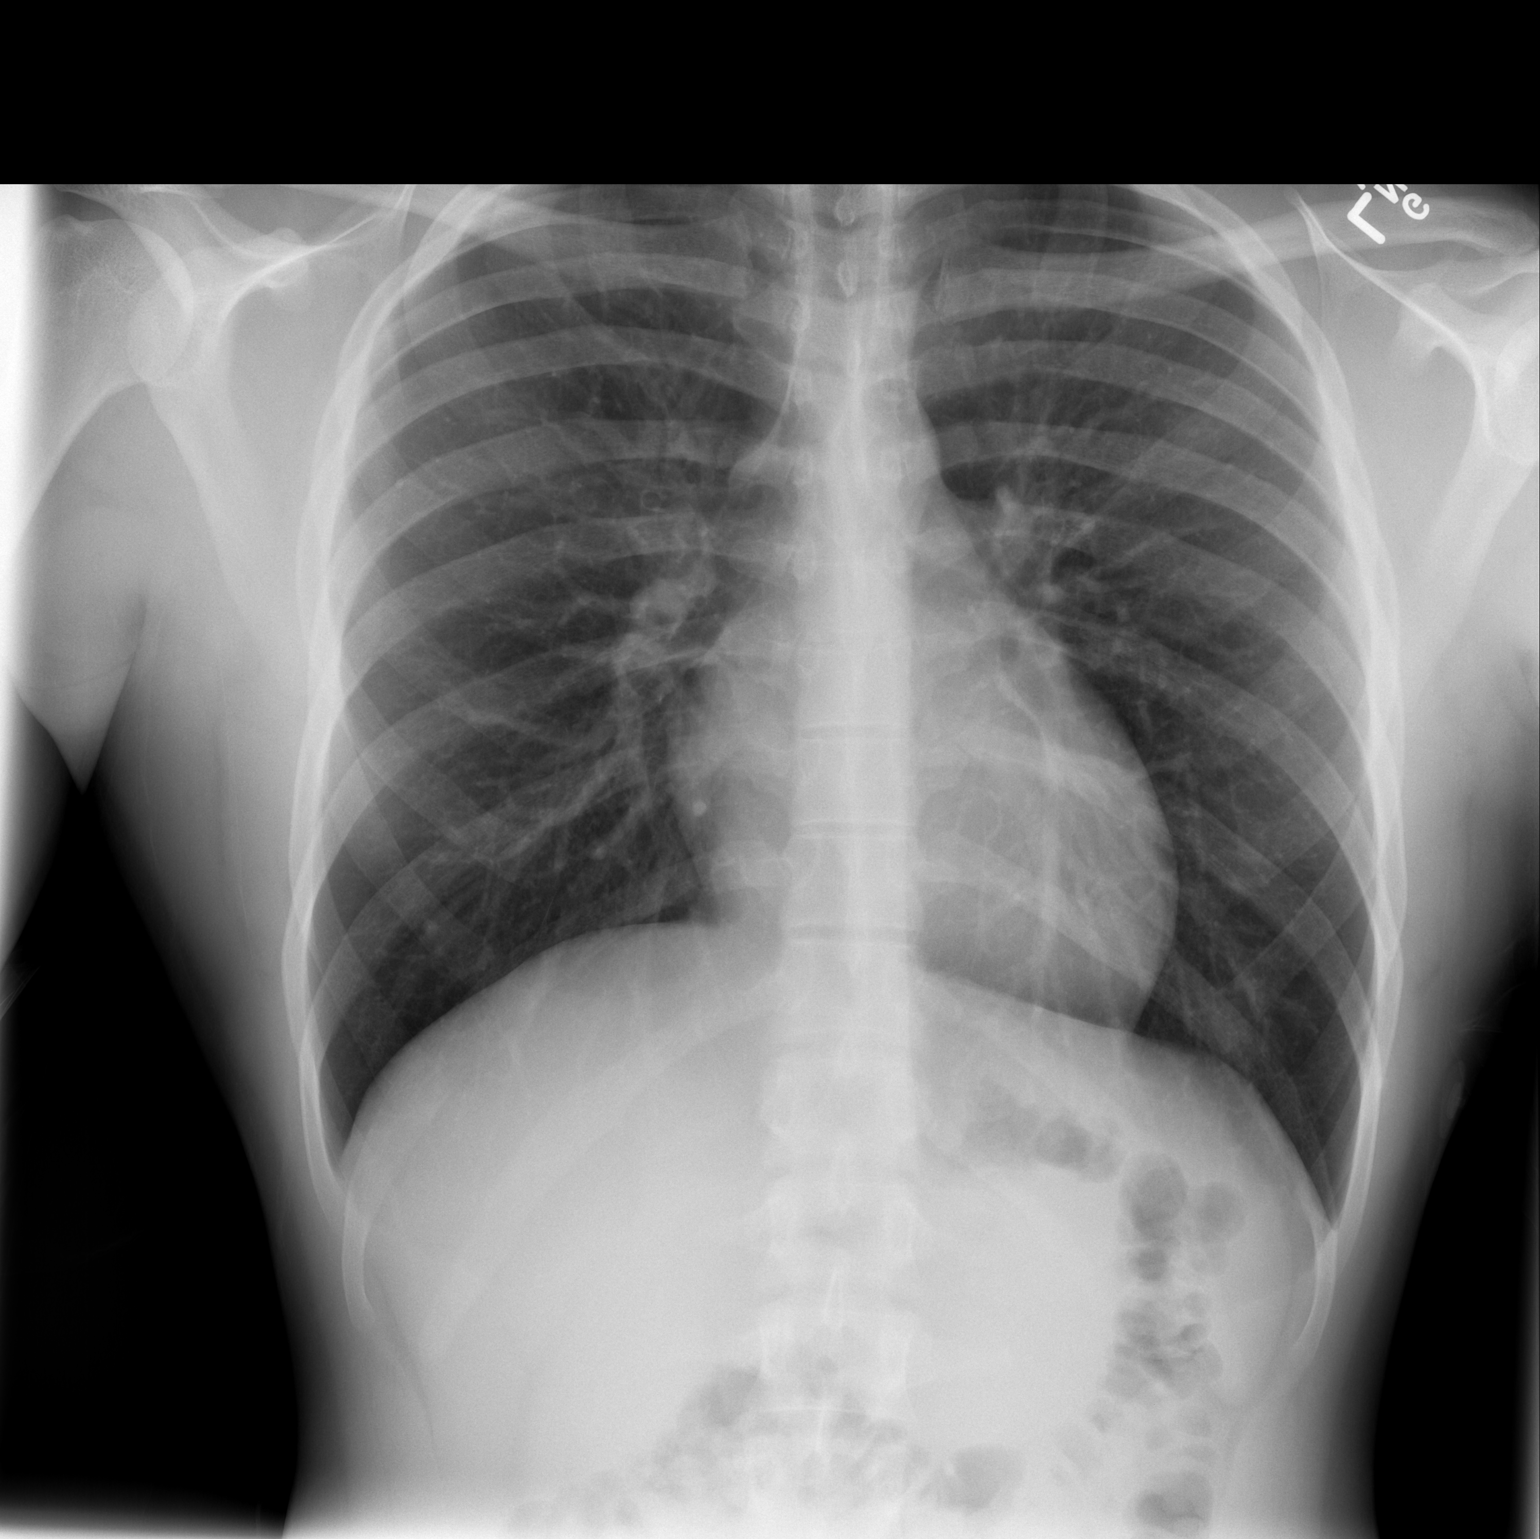

[w chest lat]
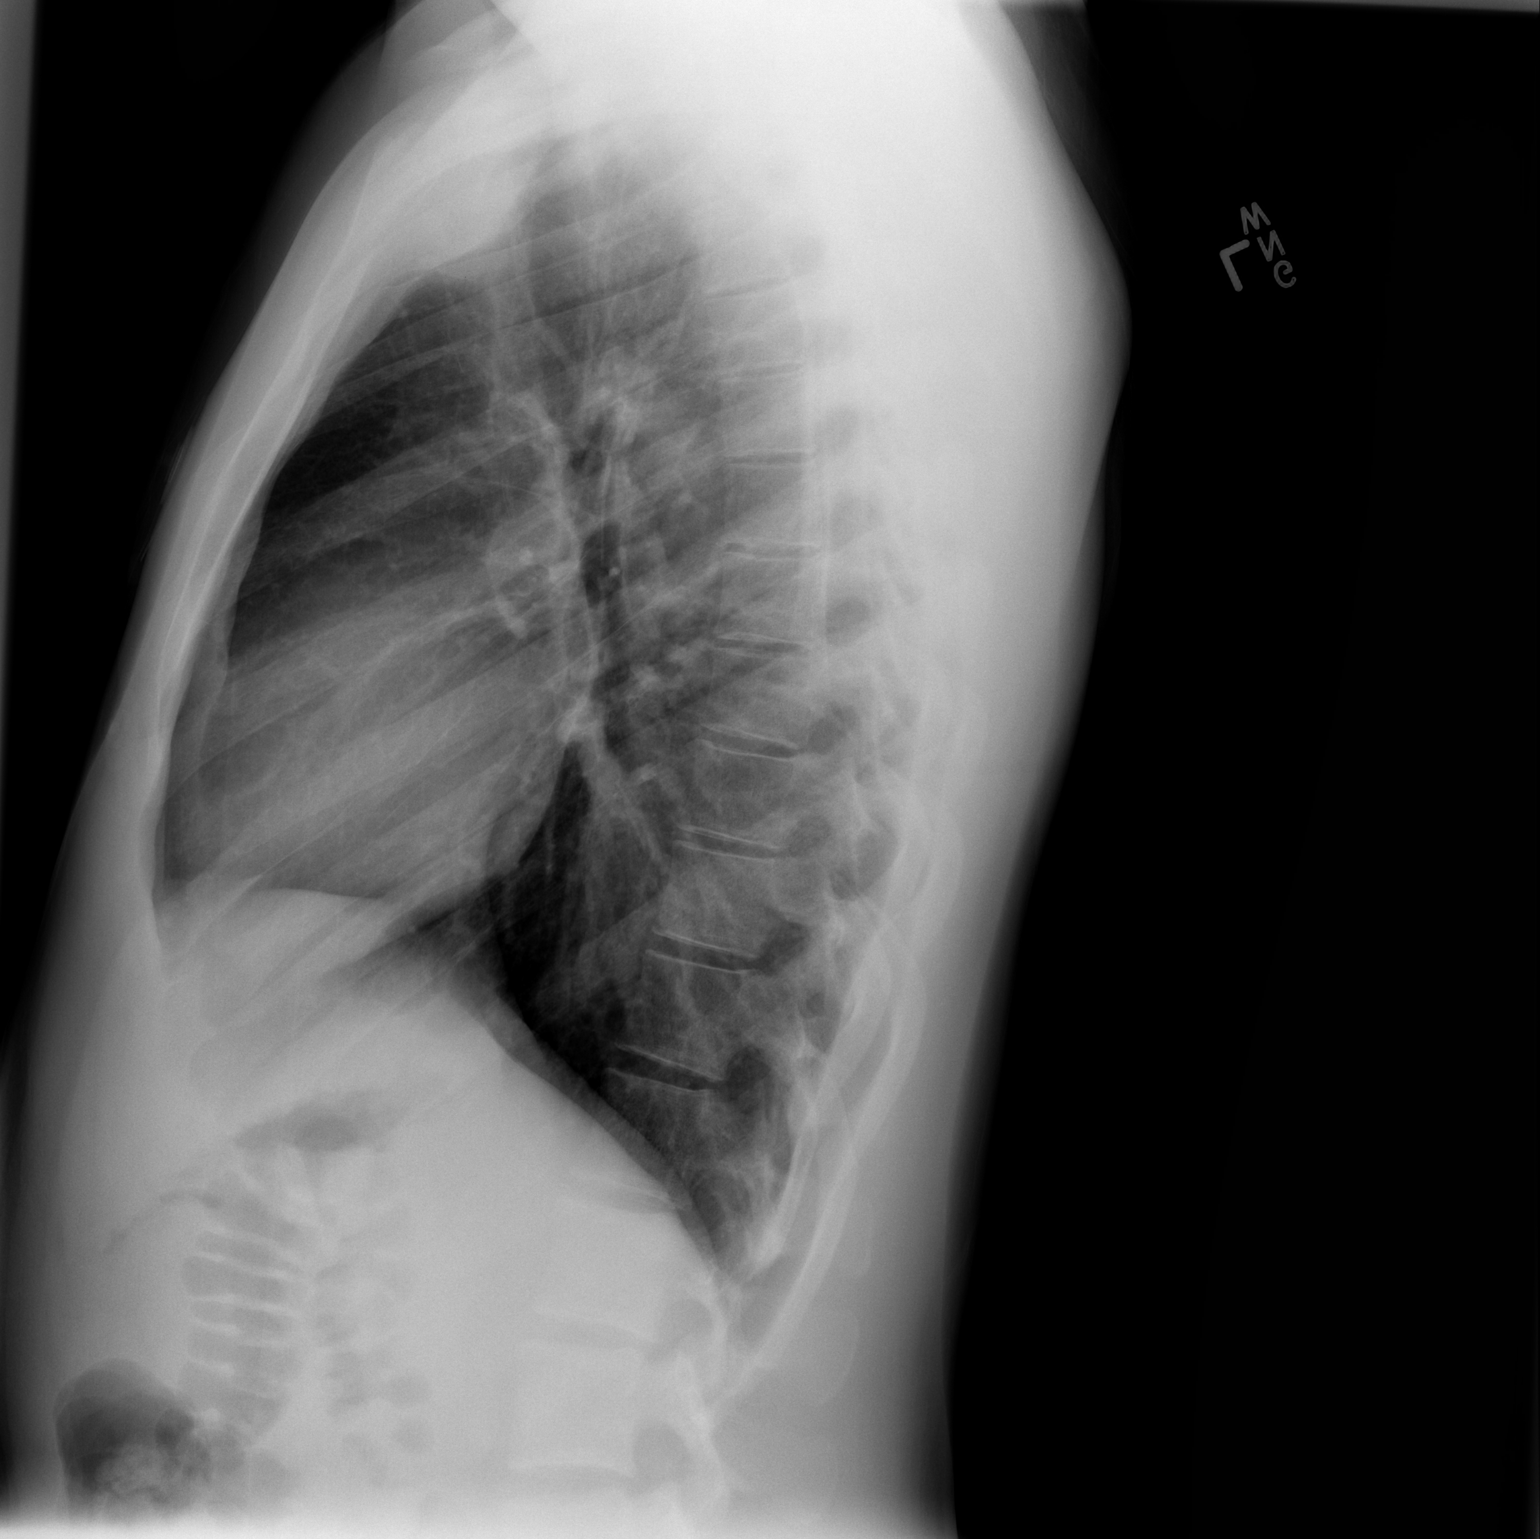

[2 of 2 positions shown; findings below may reference images not displayed]

FINDINGS: The heart size and mediastinal contours are within normal limits.
Both lungs are clear. The visualized skeletal structures are
unremarkable.
IMPRESSION: No active cardiopulmonary disease.

## 2022-10-15 DIAGNOSIS — J3089 Other allergic rhinitis: Secondary | ICD-10-CM | POA: Diagnosis not present

## 2022-10-15 DIAGNOSIS — J343 Hypertrophy of nasal turbinates: Secondary | ICD-10-CM | POA: Diagnosis not present

## 2022-10-15 DIAGNOSIS — J342 Deviated nasal septum: Secondary | ICD-10-CM | POA: Diagnosis not present

## 2022-10-18 DIAGNOSIS — E669 Obesity, unspecified: Secondary | ICD-10-CM | POA: Diagnosis not present

## 2022-10-18 DIAGNOSIS — R1110 Vomiting, unspecified: Secondary | ICD-10-CM | POA: Diagnosis not present

## 2022-10-18 DIAGNOSIS — R631 Polydipsia: Secondary | ICD-10-CM | POA: Diagnosis not present

## 2023-12-02 DIAGNOSIS — M5386 Other specified dorsopathies, lumbar region: Secondary | ICD-10-CM | POA: Diagnosis not present

## 2023-12-02 DIAGNOSIS — M9902 Segmental and somatic dysfunction of thoracic region: Secondary | ICD-10-CM | POA: Diagnosis not present

## 2023-12-02 DIAGNOSIS — M9906 Segmental and somatic dysfunction of lower extremity: Secondary | ICD-10-CM | POA: Diagnosis not present

## 2023-12-02 DIAGNOSIS — M9903 Segmental and somatic dysfunction of lumbar region: Secondary | ICD-10-CM | POA: Diagnosis not present

## 2023-12-02 DIAGNOSIS — M9905 Segmental and somatic dysfunction of pelvic region: Secondary | ICD-10-CM | POA: Diagnosis not present

## 2023-12-05 DIAGNOSIS — M9906 Segmental and somatic dysfunction of lower extremity: Secondary | ICD-10-CM | POA: Diagnosis not present

## 2023-12-05 DIAGNOSIS — M5386 Other specified dorsopathies, lumbar region: Secondary | ICD-10-CM | POA: Diagnosis not present

## 2023-12-05 DIAGNOSIS — M9905 Segmental and somatic dysfunction of pelvic region: Secondary | ICD-10-CM | POA: Diagnosis not present

## 2023-12-05 DIAGNOSIS — M9903 Segmental and somatic dysfunction of lumbar region: Secondary | ICD-10-CM | POA: Diagnosis not present

## 2023-12-05 DIAGNOSIS — M9902 Segmental and somatic dysfunction of thoracic region: Secondary | ICD-10-CM | POA: Diagnosis not present

## 2023-12-10 DIAGNOSIS — M9906 Segmental and somatic dysfunction of lower extremity: Secondary | ICD-10-CM | POA: Diagnosis not present

## 2023-12-10 DIAGNOSIS — M9903 Segmental and somatic dysfunction of lumbar region: Secondary | ICD-10-CM | POA: Diagnosis not present

## 2023-12-10 DIAGNOSIS — M9902 Segmental and somatic dysfunction of thoracic region: Secondary | ICD-10-CM | POA: Diagnosis not present

## 2023-12-10 DIAGNOSIS — M5386 Other specified dorsopathies, lumbar region: Secondary | ICD-10-CM | POA: Diagnosis not present

## 2023-12-10 DIAGNOSIS — M9905 Segmental and somatic dysfunction of pelvic region: Secondary | ICD-10-CM | POA: Diagnosis not present

## 2023-12-16 DIAGNOSIS — M9902 Segmental and somatic dysfunction of thoracic region: Secondary | ICD-10-CM | POA: Diagnosis not present

## 2023-12-16 DIAGNOSIS — M5386 Other specified dorsopathies, lumbar region: Secondary | ICD-10-CM | POA: Diagnosis not present

## 2023-12-16 DIAGNOSIS — M9905 Segmental and somatic dysfunction of pelvic region: Secondary | ICD-10-CM | POA: Diagnosis not present

## 2023-12-16 DIAGNOSIS — M9906 Segmental and somatic dysfunction of lower extremity: Secondary | ICD-10-CM | POA: Diagnosis not present

## 2023-12-16 DIAGNOSIS — M9903 Segmental and somatic dysfunction of lumbar region: Secondary | ICD-10-CM | POA: Diagnosis not present

## 2023-12-24 DIAGNOSIS — M9903 Segmental and somatic dysfunction of lumbar region: Secondary | ICD-10-CM | POA: Diagnosis not present

## 2023-12-24 DIAGNOSIS — M5386 Other specified dorsopathies, lumbar region: Secondary | ICD-10-CM | POA: Diagnosis not present

## 2023-12-24 DIAGNOSIS — M9902 Segmental and somatic dysfunction of thoracic region: Secondary | ICD-10-CM | POA: Diagnosis not present

## 2023-12-24 DIAGNOSIS — M9905 Segmental and somatic dysfunction of pelvic region: Secondary | ICD-10-CM | POA: Diagnosis not present

## 2023-12-30 DIAGNOSIS — M9903 Segmental and somatic dysfunction of lumbar region: Secondary | ICD-10-CM | POA: Diagnosis not present

## 2023-12-30 DIAGNOSIS — M5386 Other specified dorsopathies, lumbar region: Secondary | ICD-10-CM | POA: Diagnosis not present

## 2023-12-30 DIAGNOSIS — M9902 Segmental and somatic dysfunction of thoracic region: Secondary | ICD-10-CM | POA: Diagnosis not present

## 2023-12-30 DIAGNOSIS — M9905 Segmental and somatic dysfunction of pelvic region: Secondary | ICD-10-CM | POA: Diagnosis not present

## 2023-12-30 DIAGNOSIS — M9906 Segmental and somatic dysfunction of lower extremity: Secondary | ICD-10-CM | POA: Diagnosis not present

## 2024-01-05 DIAGNOSIS — F88 Other disorders of psychological development: Secondary | ICD-10-CM | POA: Diagnosis not present

## 2024-01-05 DIAGNOSIS — F984 Stereotyped movement disorders: Secondary | ICD-10-CM | POA: Diagnosis not present

## 2024-01-05 DIAGNOSIS — F802 Mixed receptive-expressive language disorder: Secondary | ICD-10-CM | POA: Diagnosis not present

## 2024-01-05 DIAGNOSIS — R488 Other symbolic dysfunctions: Secondary | ICD-10-CM | POA: Diagnosis not present

## 2024-01-07 DIAGNOSIS — M9906 Segmental and somatic dysfunction of lower extremity: Secondary | ICD-10-CM | POA: Diagnosis not present

## 2024-01-07 DIAGNOSIS — M9905 Segmental and somatic dysfunction of pelvic region: Secondary | ICD-10-CM | POA: Diagnosis not present

## 2024-01-07 DIAGNOSIS — M5386 Other specified dorsopathies, lumbar region: Secondary | ICD-10-CM | POA: Diagnosis not present

## 2024-01-07 DIAGNOSIS — M9902 Segmental and somatic dysfunction of thoracic region: Secondary | ICD-10-CM | POA: Diagnosis not present

## 2024-01-07 DIAGNOSIS — M9903 Segmental and somatic dysfunction of lumbar region: Secondary | ICD-10-CM | POA: Diagnosis not present

## 2024-01-21 DIAGNOSIS — R059 Cough, unspecified: Secondary | ICD-10-CM | POA: Diagnosis not present

## 2024-01-21 DIAGNOSIS — J3089 Other allergic rhinitis: Secondary | ICD-10-CM | POA: Diagnosis not present

## 2024-01-22 DIAGNOSIS — M9903 Segmental and somatic dysfunction of lumbar region: Secondary | ICD-10-CM | POA: Diagnosis not present

## 2024-01-22 DIAGNOSIS — M9902 Segmental and somatic dysfunction of thoracic region: Secondary | ICD-10-CM | POA: Diagnosis not present

## 2024-01-22 DIAGNOSIS — M5386 Other specified dorsopathies, lumbar region: Secondary | ICD-10-CM | POA: Diagnosis not present

## 2024-01-22 DIAGNOSIS — M9905 Segmental and somatic dysfunction of pelvic region: Secondary | ICD-10-CM | POA: Diagnosis not present

## 2024-01-26 DIAGNOSIS — Z3009 Encounter for other general counseling and advice on contraception: Secondary | ICD-10-CM | POA: Diagnosis not present

## 2024-02-04 DIAGNOSIS — R7303 Prediabetes: Secondary | ICD-10-CM | POA: Diagnosis not present

## 2024-02-04 DIAGNOSIS — Z13220 Encounter for screening for lipoid disorders: Secondary | ICD-10-CM | POA: Diagnosis not present

## 2024-02-04 DIAGNOSIS — Z23 Encounter for immunization: Secondary | ICD-10-CM | POA: Diagnosis not present

## 2024-02-04 DIAGNOSIS — Z91014 Allergy to mammalian meats: Secondary | ICD-10-CM | POA: Diagnosis not present

## 2024-02-04 DIAGNOSIS — Z0000 Encounter for general adult medical examination without abnormal findings: Secondary | ICD-10-CM | POA: Diagnosis not present

## 2024-03-04 DIAGNOSIS — F88 Other disorders of psychological development: Secondary | ICD-10-CM | POA: Diagnosis not present

## 2024-03-04 DIAGNOSIS — F802 Mixed receptive-expressive language disorder: Secondary | ICD-10-CM | POA: Diagnosis not present

## 2024-03-04 DIAGNOSIS — F840 Autistic disorder: Secondary | ICD-10-CM | POA: Diagnosis not present

## 2024-04-23 ENCOUNTER — Other Ambulatory Visit (HOSPITAL_BASED_OUTPATIENT_CLINIC_OR_DEPARTMENT_OTHER): Payer: Self-pay
